# Patient Record
Sex: Female | Born: 1955 | Race: Black or African American | Hispanic: No | Marital: Single | Smoking: Former smoker
Health system: Southern US, Community
[De-identification: ages and names within clinical notes are randomized; demographics above are authoritative.]

## PROBLEM LIST (undated history)

## (undated) DIAGNOSIS — E785 Hyperlipidemia, unspecified: Secondary | ICD-10-CM

## (undated) DIAGNOSIS — T7840XA Allergy, unspecified, initial encounter: Secondary | ICD-10-CM

## (undated) HISTORY — DX: Hyperlipidemia, unspecified: E78.5

## (undated) HISTORY — PX: BREAST BIOPSY: SHX20

## (undated) HISTORY — PX: WISDOM TOOTH EXTRACTION: SHX21

## (undated) HISTORY — DX: Allergy, unspecified, initial encounter: T78.40XA

## (undated) HISTORY — PX: TUBAL LIGATION: SHX77

---

## 1999-12-27 ENCOUNTER — Other Ambulatory Visit: Admission: RE | Admit: 1999-12-27 | Discharge: 1999-12-27 | Payer: Self-pay | Admitting: *Deleted

## 2000-01-31 ENCOUNTER — Encounter: Admission: RE | Admit: 2000-01-31 | Discharge: 2000-01-31 | Payer: Self-pay | Admitting: *Deleted

## 2000-01-31 ENCOUNTER — Encounter: Payer: Self-pay | Admitting: *Deleted

## 2000-03-18 ENCOUNTER — Encounter: Payer: Self-pay | Admitting: Family Medicine

## 2000-03-18 ENCOUNTER — Ambulatory Visit (HOSPITAL_COMMUNITY): Admission: RE | Admit: 2000-03-18 | Discharge: 2000-03-18 | Payer: Self-pay | Admitting: Family Medicine

## 2000-04-07 ENCOUNTER — Encounter: Admission: RE | Admit: 2000-04-07 | Discharge: 2000-05-08 | Payer: Self-pay | Admitting: Family Medicine

## 2003-05-23 ENCOUNTER — Encounter: Admission: RE | Admit: 2003-05-23 | Discharge: 2003-05-23 | Payer: Self-pay | Admitting: Obstetrics and Gynecology

## 2003-05-23 ENCOUNTER — Encounter: Payer: Self-pay | Admitting: Obstetrics and Gynecology

## 2004-07-30 ENCOUNTER — Encounter: Admission: RE | Admit: 2004-07-30 | Discharge: 2004-07-30 | Payer: Self-pay | Admitting: Obstetrics and Gynecology

## 2007-10-13 ENCOUNTER — Encounter: Admission: RE | Admit: 2007-10-13 | Discharge: 2007-10-13 | Payer: Self-pay | Admitting: Family Medicine

## 2008-10-23 ENCOUNTER — Encounter: Admission: RE | Admit: 2008-10-23 | Discharge: 2008-10-23 | Payer: Self-pay | Admitting: Family Medicine

## 2009-10-29 ENCOUNTER — Encounter: Admission: RE | Admit: 2009-10-29 | Discharge: 2009-10-29 | Payer: Self-pay | Admitting: Cardiology

## 2009-10-29 ENCOUNTER — Encounter: Admission: RE | Admit: 2009-10-29 | Discharge: 2009-10-29 | Payer: Self-pay | Admitting: Family Medicine

## 2010-12-02 ENCOUNTER — Encounter
Admission: RE | Admit: 2010-12-02 | Discharge: 2010-12-02 | Payer: Self-pay | Source: Home / Self Care | Attending: Family Medicine | Admitting: Family Medicine

## 2010-12-15 ENCOUNTER — Encounter: Payer: Self-pay | Admitting: Family Medicine

## 2011-11-03 ENCOUNTER — Other Ambulatory Visit: Payer: Self-pay | Admitting: Family Medicine

## 2011-11-03 DIAGNOSIS — Z1231 Encounter for screening mammogram for malignant neoplasm of breast: Secondary | ICD-10-CM

## 2011-12-08 ENCOUNTER — Ambulatory Visit: Payer: Self-pay

## 2012-09-30 ENCOUNTER — Other Ambulatory Visit: Payer: Self-pay | Admitting: Family Medicine

## 2012-09-30 DIAGNOSIS — Z1231 Encounter for screening mammogram for malignant neoplasm of breast: Secondary | ICD-10-CM

## 2012-11-10 ENCOUNTER — Ambulatory Visit
Admission: RE | Admit: 2012-11-10 | Discharge: 2012-11-10 | Disposition: A | Discharge status: Home / Self Care | Payer: Federal, State, Local not specified - PPO | Source: Ambulatory Visit | Attending: Family Medicine | Admitting: Family Medicine

## 2012-11-10 DIAGNOSIS — Z1231 Encounter for screening mammogram for malignant neoplasm of breast: Secondary | ICD-10-CM

## 2012-11-12 ENCOUNTER — Other Ambulatory Visit: Payer: Self-pay | Admitting: Family Medicine

## 2012-11-23 ENCOUNTER — Other Ambulatory Visit: Payer: Self-pay | Admitting: Family Medicine

## 2012-11-23 DIAGNOSIS — R928 Other abnormal and inconclusive findings on diagnostic imaging of breast: Secondary | ICD-10-CM

## 2012-12-01 ENCOUNTER — Ambulatory Visit
Admission: RE | Admit: 2012-12-01 | Discharge: 2012-12-01 | Disposition: A | Discharge status: Home / Self Care | Payer: Federal, State, Local not specified - PPO | Source: Ambulatory Visit | Attending: Family Medicine | Admitting: Family Medicine

## 2012-12-01 ENCOUNTER — Other Ambulatory Visit: Payer: Self-pay | Admitting: Family Medicine

## 2012-12-01 DIAGNOSIS — R928 Other abnormal and inconclusive findings on diagnostic imaging of breast: Secondary | ICD-10-CM

## 2012-12-14 ENCOUNTER — Ambulatory Visit
Admission: RE | Admit: 2012-12-14 | Discharge: 2012-12-14 | Disposition: A | Discharge status: Home / Self Care | Payer: Federal, State, Local not specified - PPO | Source: Ambulatory Visit | Attending: Family Medicine | Admitting: Family Medicine

## 2012-12-14 DIAGNOSIS — R928 Other abnormal and inconclusive findings on diagnostic imaging of breast: Secondary | ICD-10-CM

## 2013-11-04 ENCOUNTER — Ambulatory Visit (INDEPENDENT_AMBULATORY_CARE_PROVIDER_SITE_OTHER): Payer: Federal, State, Local not specified - PPO | Admitting: Podiatrist

## 2013-11-04 ENCOUNTER — Encounter: Payer: Self-pay | Admitting: Podiatrist

## 2013-11-04 ENCOUNTER — Ambulatory Visit (INDEPENDENT_AMBULATORY_CARE_PROVIDER_SITE_OTHER): Payer: Federal, State, Local not specified - PPO

## 2013-11-04 ENCOUNTER — Ambulatory Visit: Payer: Self-pay | Admitting: Podiatrist

## 2013-11-04 VITALS — BP 135/80 | HR 64 | Resp 16 | Ht 62.5 in | Wt 170.0 lb

## 2013-11-04 DIAGNOSIS — R52 Pain, unspecified: Secondary | ICD-10-CM

## 2013-11-04 DIAGNOSIS — M6740 Ganglion, unspecified site: Secondary | ICD-10-CM

## 2013-11-04 NOTE — Patient Instructions (Signed)
Ganglion Cyst A ganglion cyst is a noncancerous, fluid-filled lump that occurs near joints or tendons. The ganglion cyst grows out of a joint or the lining of a tendon.  The round or oval ganglion can be pea sized or larger than a grape. Increased activity may enlarge the size of the cyst because more fluid starts to build up.  CAUSES  It is not completely known what causes a ganglion cyst to grow. However, it may be related to:  Inflammation or irritation around the joint.  An injury.  Repetitive movements or overuse.  Arthritis. SYMPTOMS   Generally, the lump is painless without other symptoms. However, sometimes pain can be felt during activity or when pressure is applied to the lump. The lump may even be tender to the touch. Tingling, pain, numbness, or muscle weakness can occur if the ganglion cyst presses on a nerve.  DIAGNOSIS  Ganglion cysts are most often diagnosed based on a physical exam, noting where the cyst is and how it looks. Your caregiver will feel the lump and may shine a light alongside it. If it is a ganglion, a light often shines through it. Your caregiver may order an X-ray, ultrasound, or MRI to rule out other conditions. TREATMENT   Draining fluid from the lump with a needle (aspiration).  Injecting a steroid into the joint.  Surgery to remove the ganglion cyst and its stalk that is attached to the joint or tendon. However, ganglion cysts can grow back. HOME CARE INSTRUCTIONS   Do not press on the ganglion, poke it with a needle, or hit it with a heavy object. You may rub the lump gently and often. Sometimes fluid moves out of the cyst.  Only take medicines as directed by your caregiver.  Wear your brace or splint as directed by your caregiver. SEEK MEDICAL CARE IF:   Your ganglion becomes larger or more painful.  You have increased redness, red streaks, or swelling.  You have pus coming from the lump.  You have weakness or numbness in the affected  area. MAKE SURE YOU:   Understand these instructions.  Will watch your condition.  Will get help right away if you are not doing well or get worse. Document Released: 11/07/2000 Document Revised: 08/04/2012 Document Reviewed: 01/04/2008 [facility] Patient Information 2014 [place], [place].

## 2013-11-04 NOTE — Progress Notes (Signed)
MRN: [id number] Name: Darlene Cole  Sex: female Age: 57 y.o. DOB: 06-01-1956  Provider: Marlowe Aschoff P  Allergies: Review of patient's allergies indicates no known allergies.   Chief Complaint  Patient presents with  . Foot Pain    NP Painful Arch B/L;  "I have a Bunion from these shoes, that seam irritates me.  My arches have fallen."     HPI: Patient is 57 y.o. female who presents today for him bilateral feet. Patient states that her new balance shoes which she obtained at the advice of her primary care physician from the shoe market have rubbed on the top of her foot in the seen irritates the top of her foot. She now has a cyst on the top of the right foot over the first metatarsal head and causing pain. She also relates her feet have fallen arches which are also uncomfortable. 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     Past Medical History  Diagnosis Date  . Allergy   . Hyperlipidemia        Medication List       This list is accurate as of: 11/04/13  5:03 PM.  Always use your most recent med list.               BYSTOLIC 5 MG tablet  Generic drug:  nebivolol  Take 5 mg by mouth daily.     EPIPEN 2-PAK 0.3 mg/0.3 mL Soaj injection  Generic drug:  EPINEPHrine  0.3 mg once.     ibuprofen 400 MG tablet  Commonly known as:  ADVIL,MOTRIN  Take 800 mg by mouth as needed.     levocetirizine 5 MG tablet  Commonly known as:  XYZAL  Take 5 mg by mouth every evening.     lovastatin 20 MG tablet  Commonly known as:  MEVACOR  Take 20 mg by mouth.     NASONEX 50 MCG/ACT nasal spray  Generic drug:  mometasone  Place 2 sprays into the nose daily.         Past Surgical History  Procedure Laterality Date  . Tubal ligation    . Wisdom tooth extraction         Review of Systems  DATA OBTAINED: from patient intake form GENERAL: Feels well no fevers, no fatigue, no changes in appetite SKIN: No itching, no  rashes, no open lesions, no wounds EYES: No eye pain,no redness, no discharge EARS: No earache,no ringing of ears, no recent change in hearing NOSE: No congestion, no drainage, no bleeding  MOUTH/THROAT: No mouth pain, No sore throat, No difficulty chewing or swallowing  RESPIRATORY: No cough, no wheezing, no SOB CARDIAC: No chest pain,no heart palpitations,no new onset lower extremity edema  GI: No abdominal pain, No Nausea, no vomiting, no diarrhea, no heartburn or no reflux  GU: No dysuria, no increased frequency or urgency MUSCULOSKELETAL: No unrelieved bone/joint pain,  NEUROLOGIC: Awake, alert, appropriate to situation, No change in mental status. PSYCHIATRIC: No overt anxiety or sadness.No behavior issue.  AMBULATION:  Ambulates unassisted  Filed Vitals:   11/04/13 1325  BP: 135/80  Pulse: 64  Resp: 16    Physical Exam  GENERAL APPEARANCE: Alert, conversant. Appropriately groomed. No acute distress.  VASCULAR: Pedal pulses palpable and strong bilateral.  Capillary refill time is immediate to all digits,  Proximal to distal cooling it warm to warm.  Digital hair growth is present bilateral  NEUROLOGIC: sensation is intact epicritically and protectively to 5.07 monofilament at 5/5 sites bilateral.  Light touch is intact bilateral, vibratory sensation intact bilateral, achilles tendon reflex is intact bilateral.  MUSCULOSKELETAL: acceptable muscle strength, tone and stability bilateral. Minimal bunion deformity seen bilateral. DERMATOLOGIC: skin color, texture, and turger are within normal limits.  A cyst on the dorsal aspect of the right first metatarsal head is noted. Very small and measures about 6 mm in diameter and is freely movable.  Assessment  Ganglionic cyst dorsal aspect right first metatarsal head   Plan  Recommended aspiration of the cyst and the patient agreed a sterile prep was performed and the area was anesthetized with lidocaine Marcaine mixture. Next a 10 cc  syringe with a 20-gauge needle was utilized to aspirate some clear fluid from the cyst. Once aspirated dexamethasone phosphate was infiltrated into the cyst. She tolerated this well and addressed a compressive dressing was applied. Patient was instructed on aftercare. I recommended getting new shoes if she thinks that the shoes are reason she developed a cyst. I also discussed with her the possibility of getting inserts. Her inserts however are not covered by her insurance and therefore she'll continue wearing her when she obtained from the shoe market. If the cyst does not resolve I discussed with her that I would have to remove it in the office where the surgery center. She'll call of the cyst does not resolve after the aspiration.  Marlowe Aschoff DPM    EGERTON, KATHRYN P, DPM

## 2013-11-04 NOTE — Progress Notes (Deleted)
   Subjective:    Patient ID: Darlene Cole, female    DOB: Aug 13, 1956, 57 y.o.   MRN: [id number] "I have a Bunion from these shoes, that seam irritates me.  My arches have fallen."  Foot Pain This is a new (Arch Pain B/L) problem. Episode onset: 09/29/13. The problem occurs daily. The problem has been gradually worsening. Exacerbated by: sitting for long periods of time and get active again. Treatments tried: epsom salt soaks, alcohol rubs, Diabetic socks, New Balance sneakers, OTC inserts from [employer], saw a doctor at [employer]. The treatment provided no relief.      Review of Systems     Objective:   Physical Exam  Cyst dorsal top of right foot.  drainiage      Assessment & Plan:

## 2013-12-07 ENCOUNTER — Other Ambulatory Visit: Payer: Self-pay

## 2013-12-07 DIAGNOSIS — Z1231 Encounter for screening mammogram for malignant neoplasm of breast: Secondary | ICD-10-CM

## 2013-12-28 ENCOUNTER — Ambulatory Visit
Admission: RE | Admit: 2013-12-28 | Discharge: 2013-12-28 | Disposition: A | Discharge status: Home / Self Care | Payer: Federal, State, Local not specified - PPO | Source: Ambulatory Visit

## 2013-12-28 DIAGNOSIS — Z1231 Encounter for screening mammogram for malignant neoplasm of breast: Secondary | ICD-10-CM

## 2014-02-28 ENCOUNTER — Ambulatory Visit
Admission: RE | Admit: 2014-02-28 | Discharge: 2014-02-28 | Disposition: A | Discharge status: Home / Self Care | Payer: Federal, State, Local not specified - PPO | Source: Ambulatory Visit | Attending: Allergy and Immunology | Admitting: Allergy and Immunology

## 2014-02-28 ENCOUNTER — Other Ambulatory Visit: Payer: Self-pay | Admitting: Allergy and Immunology

## 2014-02-28 DIAGNOSIS — R05 Cough: Secondary | ICD-10-CM

## 2014-02-28 DIAGNOSIS — R059 Cough, unspecified: Secondary | ICD-10-CM

## 2015-07-03 ENCOUNTER — Ambulatory Visit
Admission: RE | Admit: 2015-07-03 | Discharge: 2015-07-03 | Disposition: A | Discharge status: Home / Self Care | Payer: Federal, State, Local not specified - PPO | Source: Ambulatory Visit | Attending: Family Medicine | Admitting: Family Medicine

## 2015-07-03 ENCOUNTER — Other Ambulatory Visit: Payer: Self-pay | Admitting: Family Medicine

## 2015-07-03 DIAGNOSIS — I159 Secondary hypertension, unspecified: Secondary | ICD-10-CM

## 2015-08-02 ENCOUNTER — Other Ambulatory Visit: Payer: Self-pay

## 2015-08-02 DIAGNOSIS — Z1231 Encounter for screening mammogram for malignant neoplasm of breast: Secondary | ICD-10-CM

## 2015-08-20 ENCOUNTER — Ambulatory Visit
Admission: RE | Admit: 2015-08-20 | Discharge: 2015-08-20 | Disposition: A | Discharge status: Home / Self Care | Payer: Federal, State, Local not specified - PPO | Source: Ambulatory Visit

## 2015-08-20 DIAGNOSIS — Z1231 Encounter for screening mammogram for malignant neoplasm of breast: Secondary | ICD-10-CM

## 2016-02-28 DIAGNOSIS — K080 Exfoliation of teeth due to systemic causes: Secondary | ICD-10-CM | POA: Diagnosis not present

## 2016-03-05 DIAGNOSIS — J301 Allergic rhinitis due to pollen: Secondary | ICD-10-CM | POA: Diagnosis not present

## 2016-03-05 DIAGNOSIS — J3089 Other allergic rhinitis: Secondary | ICD-10-CM | POA: Diagnosis not present

## 2016-03-19 DIAGNOSIS — I10 Essential (primary) hypertension: Secondary | ICD-10-CM | POA: Diagnosis not present

## 2016-03-19 DIAGNOSIS — E785 Hyperlipidemia, unspecified: Secondary | ICD-10-CM | POA: Diagnosis not present

## 2016-04-08 DIAGNOSIS — J3089 Other allergic rhinitis: Secondary | ICD-10-CM | POA: Diagnosis not present

## 2016-04-08 DIAGNOSIS — J3081 Allergic rhinitis due to animal (cat) (dog) hair and dander: Secondary | ICD-10-CM | POA: Diagnosis not present

## 2016-04-08 DIAGNOSIS — J301 Allergic rhinitis due to pollen: Secondary | ICD-10-CM | POA: Diagnosis not present

## 2016-04-08 DIAGNOSIS — H1045 Other chronic allergic conjunctivitis: Secondary | ICD-10-CM | POA: Diagnosis not present

## 2016-04-11 DIAGNOSIS — J301 Allergic rhinitis due to pollen: Secondary | ICD-10-CM | POA: Diagnosis not present

## 2016-04-11 DIAGNOSIS — J3089 Other allergic rhinitis: Secondary | ICD-10-CM | POA: Diagnosis not present

## 2016-04-24 DIAGNOSIS — J3089 Other allergic rhinitis: Secondary | ICD-10-CM | POA: Diagnosis not present

## 2016-04-24 DIAGNOSIS — J301 Allergic rhinitis due to pollen: Secondary | ICD-10-CM | POA: Diagnosis not present

## 2016-05-02 DIAGNOSIS — J301 Allergic rhinitis due to pollen: Secondary | ICD-10-CM | POA: Diagnosis not present

## 2016-05-02 DIAGNOSIS — J3089 Other allergic rhinitis: Secondary | ICD-10-CM | POA: Diagnosis not present

## 2016-05-07 DIAGNOSIS — J3089 Other allergic rhinitis: Secondary | ICD-10-CM | POA: Diagnosis not present

## 2016-05-07 DIAGNOSIS — J301 Allergic rhinitis due to pollen: Secondary | ICD-10-CM | POA: Diagnosis not present

## 2016-05-14 DIAGNOSIS — J301 Allergic rhinitis due to pollen: Secondary | ICD-10-CM | POA: Diagnosis not present

## 2016-05-14 DIAGNOSIS — J3089 Other allergic rhinitis: Secondary | ICD-10-CM | POA: Diagnosis not present

## 2016-05-16 DIAGNOSIS — J301 Allergic rhinitis due to pollen: Secondary | ICD-10-CM | POA: Diagnosis not present

## 2016-05-16 DIAGNOSIS — J3089 Other allergic rhinitis: Secondary | ICD-10-CM | POA: Diagnosis not present

## 2016-05-23 DIAGNOSIS — J3089 Other allergic rhinitis: Secondary | ICD-10-CM | POA: Diagnosis not present

## 2016-05-23 DIAGNOSIS — J301 Allergic rhinitis due to pollen: Secondary | ICD-10-CM | POA: Diagnosis not present

## 2016-06-18 DIAGNOSIS — K080 Exfoliation of teeth due to systemic causes: Secondary | ICD-10-CM | POA: Diagnosis not present

## 2016-09-05 ENCOUNTER — Other Ambulatory Visit: Payer: Self-pay | Admitting: Family Medicine

## 2016-09-05 DIAGNOSIS — Z1231 Encounter for screening mammogram for malignant neoplasm of breast: Secondary | ICD-10-CM

## 2016-09-09 DIAGNOSIS — I10 Essential (primary) hypertension: Secondary | ICD-10-CM | POA: Diagnosis not present

## 2016-09-09 DIAGNOSIS — Z23 Encounter for immunization: Secondary | ICD-10-CM | POA: Diagnosis not present

## 2016-09-09 DIAGNOSIS — E7800 Pure hypercholesterolemia, unspecified: Secondary | ICD-10-CM | POA: Diagnosis not present

## 2016-09-09 DIAGNOSIS — R9431 Abnormal electrocardiogram [ECG] [EKG]: Secondary | ICD-10-CM | POA: Diagnosis not present

## 2016-09-10 ENCOUNTER — Ambulatory Visit
Admission: RE | Admit: 2016-09-10 | Discharge: 2016-09-10 | Disposition: A | Discharge status: Home / Self Care | Payer: Federal, State, Local not specified - PPO | Source: Ambulatory Visit | Attending: Family Medicine | Admitting: Family Medicine

## 2016-09-10 DIAGNOSIS — Z1231 Encounter for screening mammogram for malignant neoplasm of breast: Secondary | ICD-10-CM | POA: Diagnosis not present

## 2016-09-17 DIAGNOSIS — K080 Exfoliation of teeth due to systemic causes: Secondary | ICD-10-CM | POA: Diagnosis not present

## 2016-09-19 DIAGNOSIS — E785 Hyperlipidemia, unspecified: Secondary | ICD-10-CM | POA: Diagnosis not present

## 2016-09-19 DIAGNOSIS — I10 Essential (primary) hypertension: Secondary | ICD-10-CM | POA: Diagnosis not present

## 2016-09-19 DIAGNOSIS — Z6835 Body mass index (BMI) 35.0-35.9, adult: Secondary | ICD-10-CM | POA: Diagnosis not present

## 2016-10-03 DIAGNOSIS — F5102 Adjustment insomnia: Secondary | ICD-10-CM | POA: Diagnosis not present

## 2016-10-03 DIAGNOSIS — E785 Hyperlipidemia, unspecified: Secondary | ICD-10-CM | POA: Diagnosis not present

## 2016-10-03 DIAGNOSIS — I10 Essential (primary) hypertension: Secondary | ICD-10-CM | POA: Diagnosis not present

## 2016-12-31 DIAGNOSIS — K080 Exfoliation of teeth due to systemic causes: Secondary | ICD-10-CM | POA: Diagnosis not present

## 2017-02-13 DIAGNOSIS — E785 Hyperlipidemia, unspecified: Secondary | ICD-10-CM | POA: Diagnosis not present

## 2017-02-13 DIAGNOSIS — I10 Essential (primary) hypertension: Secondary | ICD-10-CM | POA: Diagnosis not present

## 2017-02-16 DIAGNOSIS — J399 Disease of upper respiratory tract, unspecified: Secondary | ICD-10-CM | POA: Diagnosis not present

## 2017-02-16 DIAGNOSIS — Z6834 Body mass index (BMI) 34.0-34.9, adult: Secondary | ICD-10-CM | POA: Diagnosis not present

## 2017-02-16 DIAGNOSIS — E785 Hyperlipidemia, unspecified: Secondary | ICD-10-CM | POA: Diagnosis not present

## 2017-02-16 DIAGNOSIS — I10 Essential (primary) hypertension: Secondary | ICD-10-CM | POA: Diagnosis not present

## 2017-04-08 DIAGNOSIS — H1045 Other chronic allergic conjunctivitis: Secondary | ICD-10-CM | POA: Diagnosis not present

## 2017-04-08 DIAGNOSIS — J3089 Other allergic rhinitis: Secondary | ICD-10-CM | POA: Diagnosis not present

## 2017-04-08 DIAGNOSIS — J3081 Allergic rhinitis due to animal (cat) (dog) hair and dander: Secondary | ICD-10-CM | POA: Diagnosis not present

## 2017-04-08 DIAGNOSIS — J301 Allergic rhinitis due to pollen: Secondary | ICD-10-CM | POA: Diagnosis not present

## 2017-04-21 DIAGNOSIS — K080 Exfoliation of teeth due to systemic causes: Secondary | ICD-10-CM | POA: Diagnosis not present

## 2017-07-02 DIAGNOSIS — I10 Essential (primary) hypertension: Secondary | ICD-10-CM | POA: Diagnosis not present

## 2017-07-02 DIAGNOSIS — E785 Hyperlipidemia, unspecified: Secondary | ICD-10-CM | POA: Diagnosis not present

## 2017-07-07 DIAGNOSIS — I10 Essential (primary) hypertension: Secondary | ICD-10-CM | POA: Diagnosis not present

## 2017-07-07 DIAGNOSIS — E785 Hyperlipidemia, unspecified: Secondary | ICD-10-CM | POA: Diagnosis not present

## 2017-08-03 DIAGNOSIS — K080 Exfoliation of teeth due to systemic causes: Secondary | ICD-10-CM | POA: Diagnosis not present

## 2017-12-04 DIAGNOSIS — J301 Allergic rhinitis due to pollen: Secondary | ICD-10-CM | POA: Diagnosis not present

## 2017-12-04 DIAGNOSIS — J3089 Other allergic rhinitis: Secondary | ICD-10-CM | POA: Diagnosis not present

## 2017-12-04 DIAGNOSIS — J3081 Allergic rhinitis due to animal (cat) (dog) hair and dander: Secondary | ICD-10-CM | POA: Diagnosis not present

## 2017-12-04 DIAGNOSIS — Z91018 Allergy to other foods: Secondary | ICD-10-CM | POA: Diagnosis not present

## 2017-12-08 DIAGNOSIS — K080 Exfoliation of teeth due to systemic causes: Secondary | ICD-10-CM | POA: Diagnosis not present

## 2017-12-10 DIAGNOSIS — E785 Hyperlipidemia, unspecified: Secondary | ICD-10-CM | POA: Diagnosis not present

## 2017-12-10 DIAGNOSIS — I10 Essential (primary) hypertension: Secondary | ICD-10-CM | POA: Diagnosis not present

## 2017-12-14 DIAGNOSIS — E785 Hyperlipidemia, unspecified: Secondary | ICD-10-CM | POA: Diagnosis not present

## 2017-12-14 DIAGNOSIS — R635 Abnormal weight gain: Secondary | ICD-10-CM | POA: Diagnosis not present

## 2017-12-14 DIAGNOSIS — F1010 Alcohol abuse, uncomplicated: Secondary | ICD-10-CM | POA: Diagnosis not present

## 2017-12-14 DIAGNOSIS — I10 Essential (primary) hypertension: Secondary | ICD-10-CM | POA: Diagnosis not present

## 2017-12-28 DIAGNOSIS — I10 Essential (primary) hypertension: Secondary | ICD-10-CM | POA: Diagnosis not present

## 2018-01-28 DIAGNOSIS — R635 Abnormal weight gain: Secondary | ICD-10-CM | POA: Diagnosis not present

## 2018-01-28 DIAGNOSIS — F1010 Alcohol abuse, uncomplicated: Secondary | ICD-10-CM | POA: Diagnosis not present

## 2018-01-28 DIAGNOSIS — I10 Essential (primary) hypertension: Secondary | ICD-10-CM | POA: Diagnosis not present

## 2018-01-28 DIAGNOSIS — E785 Hyperlipidemia, unspecified: Secondary | ICD-10-CM | POA: Diagnosis not present

## 2018-02-01 DIAGNOSIS — E785 Hyperlipidemia, unspecified: Secondary | ICD-10-CM | POA: Diagnosis not present

## 2018-02-01 DIAGNOSIS — F1010 Alcohol abuse, uncomplicated: Secondary | ICD-10-CM | POA: Diagnosis not present

## 2018-02-01 DIAGNOSIS — I10 Essential (primary) hypertension: Secondary | ICD-10-CM | POA: Diagnosis not present

## 2018-02-04 DIAGNOSIS — K080 Exfoliation of teeth due to systemic causes: Secondary | ICD-10-CM | POA: Diagnosis not present

## 2018-03-11 ENCOUNTER — Other Ambulatory Visit: Payer: Self-pay | Admitting: Family Medicine

## 2018-03-11 DIAGNOSIS — Z1231 Encounter for screening mammogram for malignant neoplasm of breast: Secondary | ICD-10-CM

## 2018-03-11 DIAGNOSIS — E785 Hyperlipidemia, unspecified: Secondary | ICD-10-CM | POA: Diagnosis not present

## 2018-03-11 DIAGNOSIS — F1010 Alcohol abuse, uncomplicated: Secondary | ICD-10-CM | POA: Diagnosis not present

## 2018-03-26 DIAGNOSIS — E785 Hyperlipidemia, unspecified: Secondary | ICD-10-CM | POA: Diagnosis not present

## 2018-04-01 ENCOUNTER — Ambulatory Visit
Admission: RE | Admit: 2018-04-01 | Discharge: 2018-04-01 | Disposition: A | Discharge status: Home / Self Care | Payer: Federal, State, Local not specified - PPO | Source: Ambulatory Visit | Attending: Family Medicine | Admitting: Family Medicine

## 2018-04-01 DIAGNOSIS — Z1231 Encounter for screening mammogram for malignant neoplasm of breast: Secondary | ICD-10-CM | POA: Diagnosis not present

## 2018-04-27 DIAGNOSIS — K080 Exfoliation of teeth due to systemic causes: Secondary | ICD-10-CM | POA: Diagnosis not present

## 2018-06-21 DIAGNOSIS — E785 Hyperlipidemia, unspecified: Secondary | ICD-10-CM | POA: Diagnosis not present

## 2018-06-21 DIAGNOSIS — I10 Essential (primary) hypertension: Secondary | ICD-10-CM | POA: Diagnosis not present

## 2018-06-21 DIAGNOSIS — F1010 Alcohol abuse, uncomplicated: Secondary | ICD-10-CM | POA: Diagnosis not present

## 2018-08-02 DIAGNOSIS — I10 Essential (primary) hypertension: Secondary | ICD-10-CM | POA: Diagnosis not present

## 2018-08-02 DIAGNOSIS — E785 Hyperlipidemia, unspecified: Secondary | ICD-10-CM | POA: Diagnosis not present

## 2018-08-02 DIAGNOSIS — Z6827 Body mass index (BMI) 27.0-27.9, adult: Secondary | ICD-10-CM | POA: Diagnosis not present

## 2018-09-01 DIAGNOSIS — K080 Exfoliation of teeth due to systemic causes: Secondary | ICD-10-CM | POA: Diagnosis not present

## 2018-11-01 DIAGNOSIS — I10 Essential (primary) hypertension: Secondary | ICD-10-CM | POA: Diagnosis not present

## 2018-11-01 DIAGNOSIS — Z6826 Body mass index (BMI) 26.0-26.9, adult: Secondary | ICD-10-CM | POA: Diagnosis not present

## 2018-12-06 DIAGNOSIS — H1045 Other chronic allergic conjunctivitis: Secondary | ICD-10-CM | POA: Diagnosis not present

## 2018-12-06 DIAGNOSIS — J3081 Allergic rhinitis due to animal (cat) (dog) hair and dander: Secondary | ICD-10-CM | POA: Diagnosis not present

## 2018-12-06 DIAGNOSIS — J301 Allergic rhinitis due to pollen: Secondary | ICD-10-CM | POA: Diagnosis not present

## 2018-12-06 DIAGNOSIS — J3089 Other allergic rhinitis: Secondary | ICD-10-CM | POA: Diagnosis not present

## 2019-03-28 DIAGNOSIS — I10 Essential (primary) hypertension: Secondary | ICD-10-CM | POA: Diagnosis not present

## 2019-03-28 DIAGNOSIS — F1010 Alcohol abuse, uncomplicated: Secondary | ICD-10-CM | POA: Diagnosis not present

## 2019-03-28 DIAGNOSIS — R635 Abnormal weight gain: Secondary | ICD-10-CM | POA: Diagnosis not present

## 2019-03-28 DIAGNOSIS — F5102 Adjustment insomnia: Secondary | ICD-10-CM | POA: Diagnosis not present

## 2019-03-28 DIAGNOSIS — E785 Hyperlipidemia, unspecified: Secondary | ICD-10-CM | POA: Diagnosis not present

## 2019-08-05 DIAGNOSIS — E7800 Pure hypercholesterolemia, unspecified: Secondary | ICD-10-CM | POA: Diagnosis not present

## 2019-08-05 DIAGNOSIS — E785 Hyperlipidemia, unspecified: Secondary | ICD-10-CM | POA: Diagnosis not present

## 2019-08-05 DIAGNOSIS — I10 Essential (primary) hypertension: Secondary | ICD-10-CM | POA: Diagnosis not present

## 2019-08-05 DIAGNOSIS — Z23 Encounter for immunization: Secondary | ICD-10-CM | POA: Diagnosis not present

## 2019-12-05 DIAGNOSIS — H1045 Other chronic allergic conjunctivitis: Secondary | ICD-10-CM | POA: Diagnosis not present

## 2019-12-05 DIAGNOSIS — J301 Allergic rhinitis due to pollen: Secondary | ICD-10-CM | POA: Diagnosis not present

## 2019-12-05 DIAGNOSIS — J3089 Other allergic rhinitis: Secondary | ICD-10-CM | POA: Diagnosis not present

## 2019-12-05 DIAGNOSIS — J3081 Allergic rhinitis due to animal (cat) (dog) hair and dander: Secondary | ICD-10-CM | POA: Diagnosis not present

## 2020-01-27 DIAGNOSIS — I10 Essential (primary) hypertension: Secondary | ICD-10-CM | POA: Diagnosis not present

## 2020-01-27 DIAGNOSIS — E785 Hyperlipidemia, unspecified: Secondary | ICD-10-CM | POA: Diagnosis not present

## 2020-01-27 DIAGNOSIS — F1010 Alcohol abuse, uncomplicated: Secondary | ICD-10-CM | POA: Diagnosis not present

## 2020-01-27 DIAGNOSIS — R635 Abnormal weight gain: Secondary | ICD-10-CM | POA: Diagnosis not present

## 2020-04-30 DIAGNOSIS — I10 Essential (primary) hypertension: Secondary | ICD-10-CM | POA: Diagnosis not present

## 2020-04-30 DIAGNOSIS — F1010 Alcohol abuse, uncomplicated: Secondary | ICD-10-CM | POA: Diagnosis not present

## 2020-05-04 IMAGING — MG DIGITAL SCREENING BILATERAL MAMMOGRAM WITH TOMO AND CAD
8 series · 8 of 24 positions shown · non-contrast
Comparison: Previous exam(s).

CLINICAL DATA: Screening.

EXAM:
DIGITAL SCREENING BILATERAL MAMMOGRAM WITH TOMO AND CAD

[L MLO synth-2D]
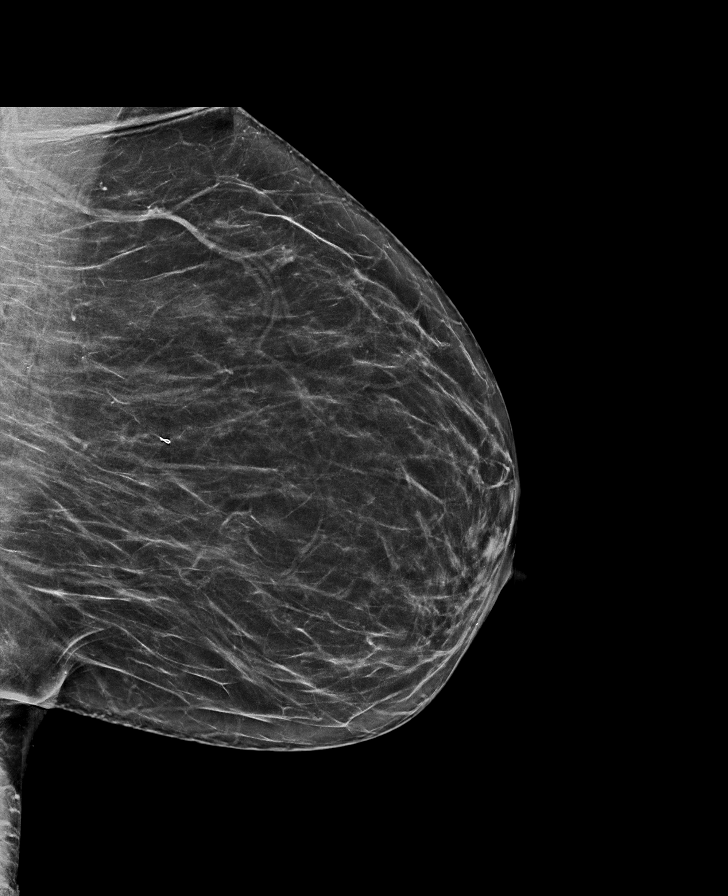

[L CC synth-2D]
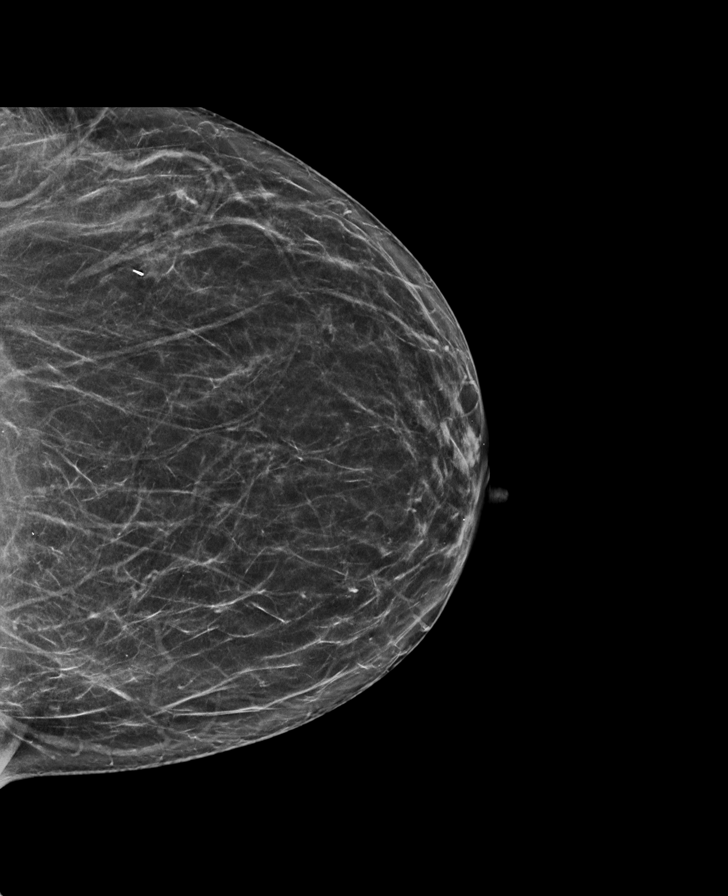

[R CC synth-2D]
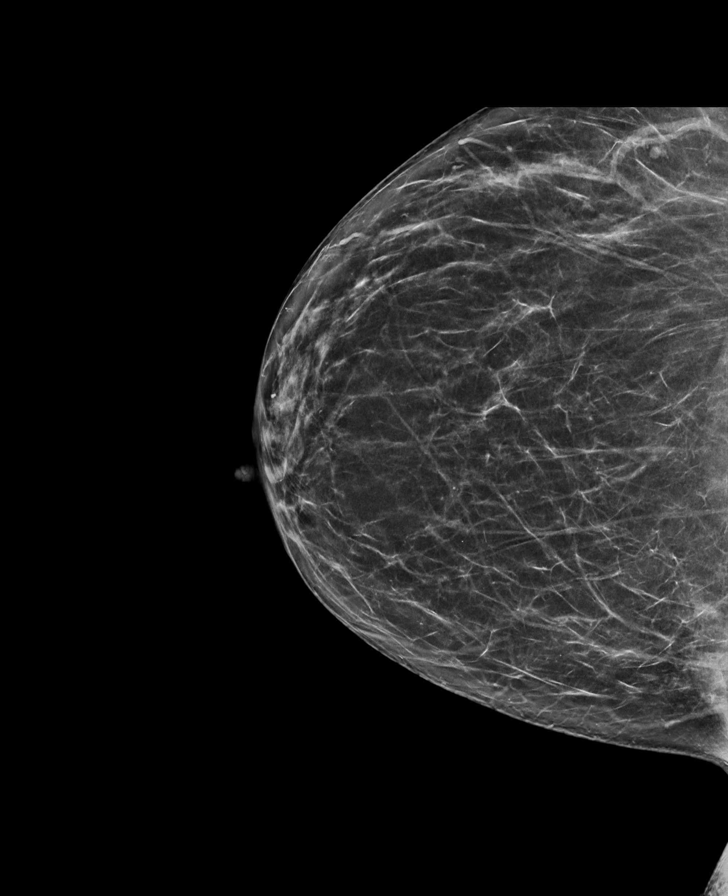

[R MLO synth-2D]
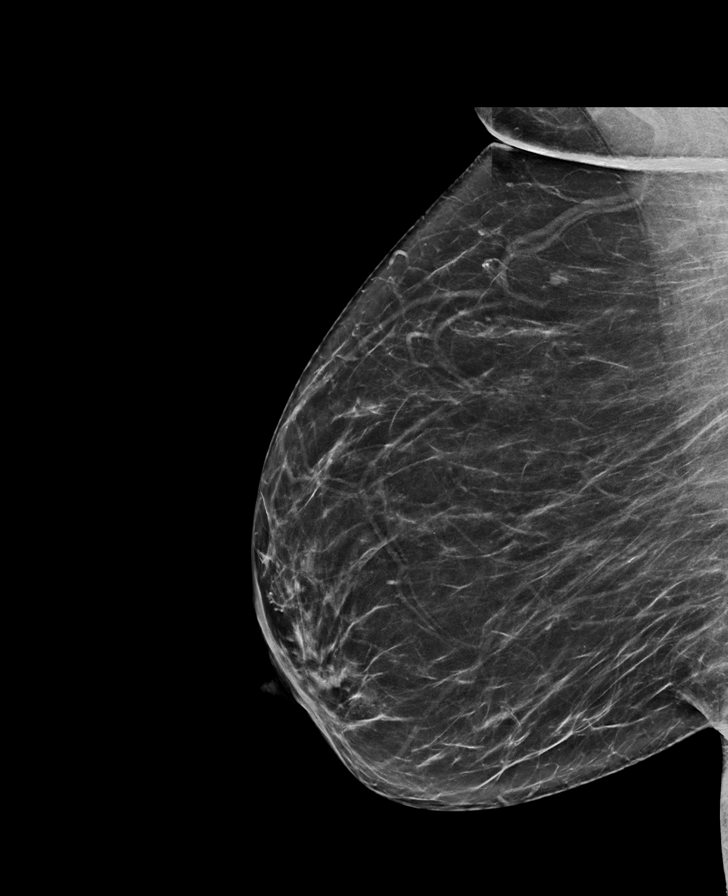

[L CC tomo · tomo slice 37/72.0]
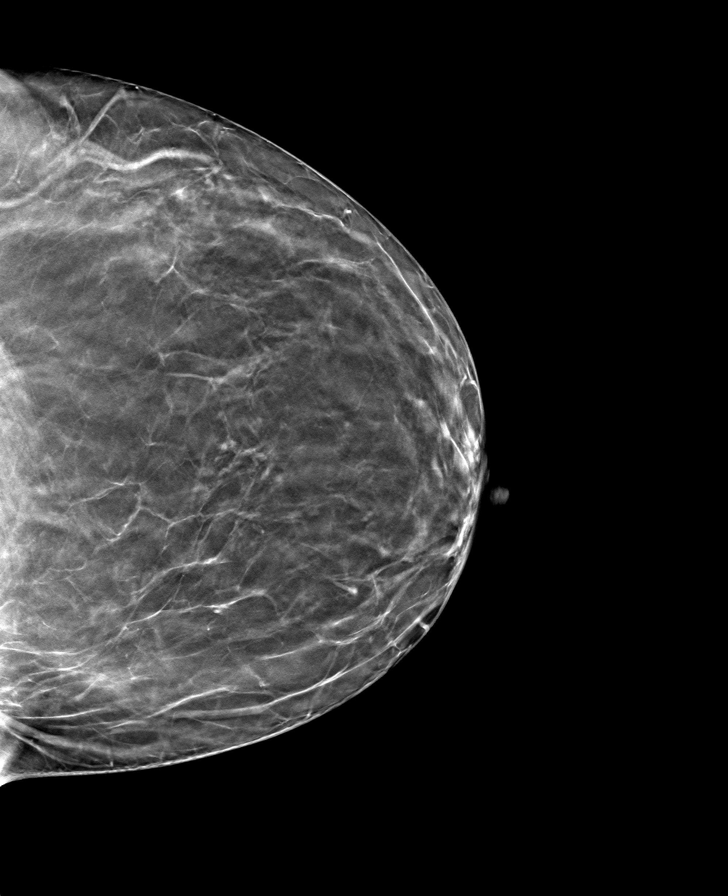

[R MLO tomo · tomo slice 39/77.0]
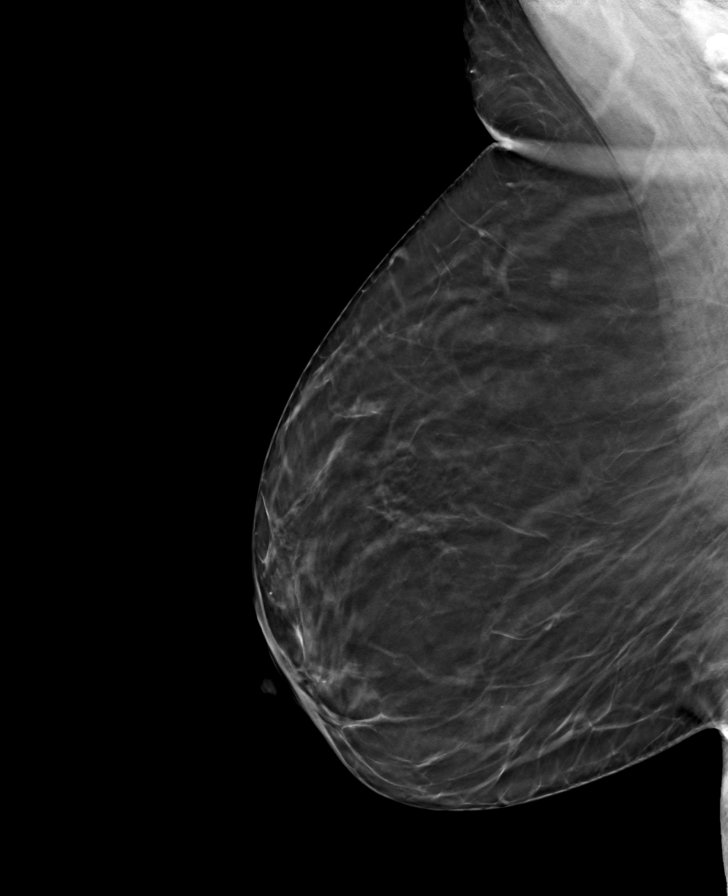

[R CC tomo · tomo slice 37/73.0]
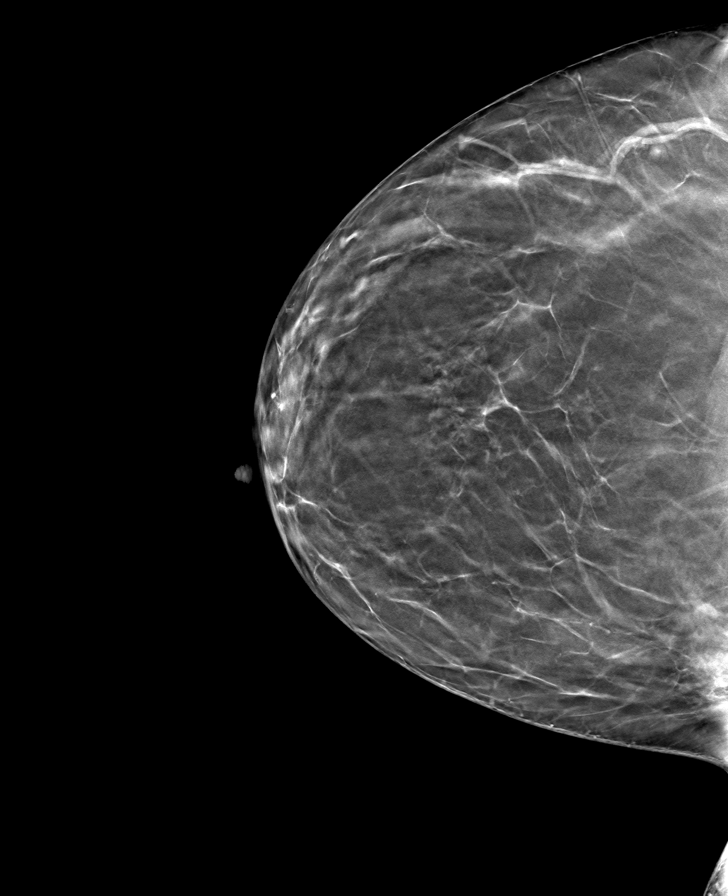

[L MLO tomo · tomo slice 38/75.0]
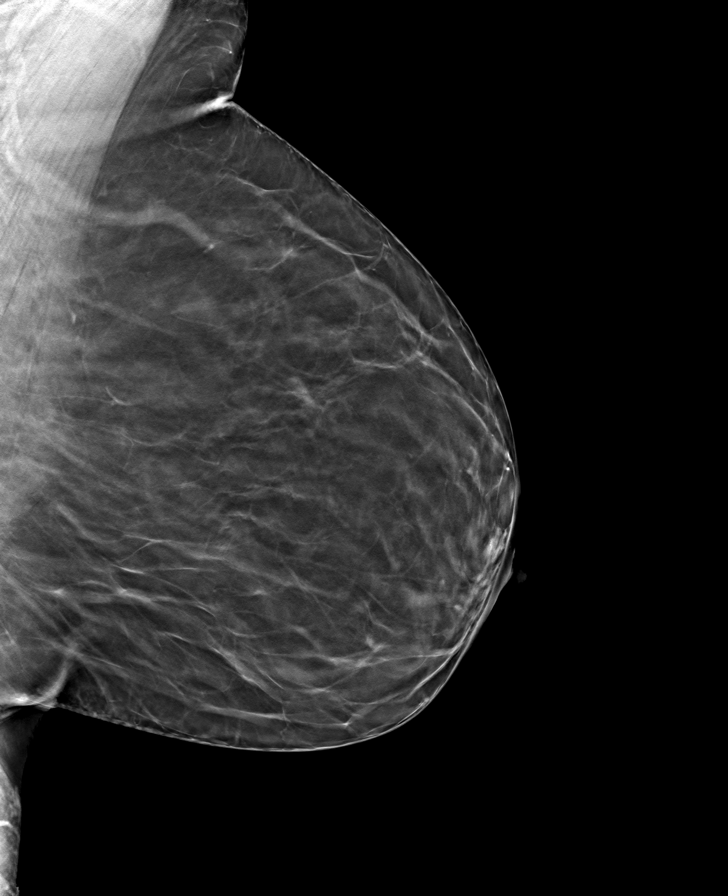

[8 of 24 positions shown; findings below may reference images not displayed]

ACR Breast Density Category b: There are scattered areas of
fibroglandular density.
FINDINGS: There are no findings suspicious for malignancy. Images were
processed with CAD.
IMPRESSION: No mammographic evidence of malignancy. A result letter of this
screening mammogram will be mailed directly to the patient.

RECOMMENDATION:
Screening mammogram in one year. (Code:[id number]-775)

BI-RADS CATEGORY  1: Negative.

## 2020-06-01 DIAGNOSIS — I10 Essential (primary) hypertension: Secondary | ICD-10-CM | POA: Diagnosis not present

## 2020-06-01 DIAGNOSIS — E785 Hyperlipidemia, unspecified: Secondary | ICD-10-CM | POA: Diagnosis not present

## 2020-06-01 DIAGNOSIS — F1010 Alcohol abuse, uncomplicated: Secondary | ICD-10-CM | POA: Diagnosis not present

## 2020-10-29 DIAGNOSIS — Z23 Encounter for immunization: Secondary | ICD-10-CM | POA: Diagnosis not present

## 2020-11-02 DIAGNOSIS — E785 Hyperlipidemia, unspecified: Secondary | ICD-10-CM | POA: Diagnosis not present

## 2020-11-02 DIAGNOSIS — I10 Essential (primary) hypertension: Secondary | ICD-10-CM | POA: Diagnosis not present

## 2020-11-05 DIAGNOSIS — F1010 Alcohol abuse, uncomplicated: Secondary | ICD-10-CM | POA: Diagnosis not present

## 2020-11-05 DIAGNOSIS — I10 Essential (primary) hypertension: Secondary | ICD-10-CM | POA: Diagnosis not present

## 2020-11-05 DIAGNOSIS — E785 Hyperlipidemia, unspecified: Secondary | ICD-10-CM | POA: Diagnosis not present

## 2020-12-03 DIAGNOSIS — H1045 Other chronic allergic conjunctivitis: Secondary | ICD-10-CM | POA: Diagnosis not present

## 2020-12-03 DIAGNOSIS — J3081 Allergic rhinitis due to animal (cat) (dog) hair and dander: Secondary | ICD-10-CM | POA: Diagnosis not present

## 2020-12-03 DIAGNOSIS — J301 Allergic rhinitis due to pollen: Secondary | ICD-10-CM | POA: Diagnosis not present

## 2020-12-03 DIAGNOSIS — J3089 Other allergic rhinitis: Secondary | ICD-10-CM | POA: Diagnosis not present

## 2020-12-06 DIAGNOSIS — I10 Essential (primary) hypertension: Secondary | ICD-10-CM | POA: Diagnosis not present

## 2020-12-06 DIAGNOSIS — E785 Hyperlipidemia, unspecified: Secondary | ICD-10-CM | POA: Diagnosis not present

## 2020-12-06 DIAGNOSIS — F1010 Alcohol abuse, uncomplicated: Secondary | ICD-10-CM | POA: Diagnosis not present

## 2021-02-28 DIAGNOSIS — Z23 Encounter for immunization: Secondary | ICD-10-CM | POA: Diagnosis not present

## 2021-03-01 DIAGNOSIS — F1010 Alcohol abuse, uncomplicated: Secondary | ICD-10-CM | POA: Diagnosis not present

## 2021-03-01 DIAGNOSIS — D649 Anemia, unspecified: Secondary | ICD-10-CM | POA: Diagnosis not present

## 2021-03-01 DIAGNOSIS — D644 Congenital dyserythropoietic anemia: Secondary | ICD-10-CM | POA: Diagnosis not present

## 2021-03-01 DIAGNOSIS — E785 Hyperlipidemia, unspecified: Secondary | ICD-10-CM | POA: Diagnosis not present

## 2021-03-01 DIAGNOSIS — I10 Essential (primary) hypertension: Secondary | ICD-10-CM | POA: Diagnosis not present

## 2021-07-05 DIAGNOSIS — E785 Hyperlipidemia, unspecified: Secondary | ICD-10-CM | POA: Diagnosis not present

## 2021-07-05 DIAGNOSIS — D649 Anemia, unspecified: Secondary | ICD-10-CM | POA: Diagnosis not present

## 2021-07-05 DIAGNOSIS — F1610 Hallucinogen abuse, uncomplicated: Secondary | ICD-10-CM | POA: Diagnosis not present

## 2021-07-05 DIAGNOSIS — I10 Essential (primary) hypertension: Secondary | ICD-10-CM | POA: Diagnosis not present

## 2021-07-05 DIAGNOSIS — Z6829 Body mass index (BMI) 29.0-29.9, adult: Secondary | ICD-10-CM | POA: Diagnosis not present

## 2021-08-07 DIAGNOSIS — H5203 Hypermetropia, bilateral: Secondary | ICD-10-CM | POA: Diagnosis not present

## 2021-08-29 ENCOUNTER — Other Ambulatory Visit: Payer: Self-pay | Admitting: Family Medicine

## 2021-08-29 DIAGNOSIS — Z1231 Encounter for screening mammogram for malignant neoplasm of breast: Secondary | ICD-10-CM

## 2021-08-30 ENCOUNTER — Ambulatory Visit
Admission: RE | Admit: 2021-08-30 | Discharge: 2021-08-30 | Disposition: A | Discharge status: Home / Self Care | Payer: Federal, State, Local not specified - PPO | Source: Ambulatory Visit | Attending: Family Medicine | Admitting: Family Medicine

## 2021-08-30 ENCOUNTER — Other Ambulatory Visit: Payer: Self-pay

## 2021-08-30 DIAGNOSIS — Z1231 Encounter for screening mammogram for malignant neoplasm of breast: Secondary | ICD-10-CM

## 2021-09-05 ENCOUNTER — Other Ambulatory Visit: Payer: Self-pay | Admitting: Family Medicine

## 2021-09-05 DIAGNOSIS — R928 Other abnormal and inconclusive findings on diagnostic imaging of breast: Secondary | ICD-10-CM

## 2021-09-21 ENCOUNTER — Ambulatory Visit
Admission: RE | Admit: 2021-09-21 | Discharge: 2021-09-21 | Disposition: A | Discharge status: Home / Self Care | Payer: Federal, State, Local not specified - PPO | Source: Ambulatory Visit | Attending: Family Medicine | Admitting: Family Medicine

## 2021-09-21 ENCOUNTER — Other Ambulatory Visit: Payer: Self-pay | Admitting: Family Medicine

## 2021-09-21 DIAGNOSIS — R928 Other abnormal and inconclusive findings on diagnostic imaging of breast: Secondary | ICD-10-CM

## 2021-09-21 DIAGNOSIS — N6489 Other specified disorders of breast: Secondary | ICD-10-CM

## 2021-09-21 DIAGNOSIS — R922 Inconclusive mammogram: Secondary | ICD-10-CM | POA: Diagnosis not present

## 2021-09-23 ENCOUNTER — Other Ambulatory Visit: Payer: Federal, State, Local not specified - PPO

## 2021-10-04 DIAGNOSIS — E785 Hyperlipidemia, unspecified: Secondary | ICD-10-CM | POA: Diagnosis not present

## 2021-10-04 DIAGNOSIS — I10 Essential (primary) hypertension: Secondary | ICD-10-CM | POA: Diagnosis not present

## 2021-10-04 DIAGNOSIS — F1010 Alcohol abuse, uncomplicated: Secondary | ICD-10-CM | POA: Diagnosis not present

## 2022-02-04 ENCOUNTER — Other Ambulatory Visit: Payer: Self-pay

## 2022-02-04 ENCOUNTER — Other Ambulatory Visit (HOSPITAL_COMMUNITY)
Admission: RE | Admit: 2022-02-04 | Discharge: 2022-02-04 | Disposition: A | Discharge status: Home / Self Care | Payer: Federal, State, Local not specified - PPO | Source: Ambulatory Visit | Attending: Medical | Admitting: Medical

## 2022-02-04 ENCOUNTER — Ambulatory Visit (INDEPENDENT_AMBULATORY_CARE_PROVIDER_SITE_OTHER): Payer: Federal, State, Local not specified - PPO | Admitting: Obstetrics and Gynecology

## 2022-02-04 ENCOUNTER — Encounter: Payer: Self-pay | Admitting: Obstetrics and Gynecology

## 2022-02-04 ENCOUNTER — Encounter: Payer: Federal, State, Local not specified - PPO | Admitting: Medical

## 2022-02-04 VITALS — BP 122/62 | HR 62 | Ht 63.0 in | Wt 157.5 lb

## 2022-02-04 DIAGNOSIS — Z124 Encounter for screening for malignant neoplasm of cervix: Secondary | ICD-10-CM

## 2022-02-04 DIAGNOSIS — Z01419 Encounter for gynecological examination (general) (routine) without abnormal findings: Secondary | ICD-10-CM

## 2022-02-04 DIAGNOSIS — Z113 Encounter for screening for infections with a predominantly sexual mode of transmission: Secondary | ICD-10-CM

## 2022-02-04 NOTE — Progress Notes (Signed)
Obstetrics and Gynecology ?Annual Patient Evaluation ? ?Appointment Date: 02/04/2022 ? ?OBGYN Clinic: [facility]  ? ?Primary Care Provider: Renaye Rakers ? ? ?Chief Complaint:  ?Chief Complaint  ?Patient presents with  ? Gynecologic Exam  ? ? ?History of Present Illness: Darlene Cole is a 66 y.o. African-American [phone] (LMP: age 55), seen for the above chief complaint.  ? ?She reports no issues today. She would like to have a Pap smear and also wants testing for STIs. She recalls her last Pap was in 2013.  ? ?She states she had a BTL in 1981 and her last period was approximately at the age of 66yo. She states has not had any bleeding since then. She states she never took medications for symptoms of menopause. Today, she denies VB, discharge, itching, pain.  ? ? ?Review of Systems: Pertinent items noted in HPI and remainder of comprehensive ROS otherwise negative.  ? ? ?Past Medical History:  ?Past Medical History:  ?Diagnosis Date  ? Allergy   ? Hyperlipidemia   ? ? ?Past Surgical History:  ?Past Surgical History:  ?Procedure Laterality Date  ? BREAST BIOPSY Left pt unsure  ? benign  ? TUBAL LIGATION    ? WISDOM TOOTH EXTRACTION    ? ? ?Past Obstetrical History:  ?OB History  ?Gravida Para Term Preterm AB Living  ?5       3 3   ?SAB IAB Ectopic Multiple Live Births  ?        3  ?  ?# Outcome Date GA Lbr Len/2nd Weight Sex Delivery Anes PTL Lv  ?5 Gravida           ?4 Gravida           ?3 AB           ?2 AB           ?1 AB           ?  ?Obstetric Comments  ?Svd x 3  ? ? ?Past Gynecological History: As per HPI. ?History of HRT use: No. ? ?Social History:  ?Social History  ? ?Socioeconomic History  ? Marital status: Single  ?  Spouse name: Not on file  ? Number of children: Not on file  ? Years of education: Not on file  ? Highest education level: Not on file  ?Occupational History  ? Not on file  ?Tobacco Use  ? Smoking status: Former  ? Smokeless tobacco: Never  ?Substance and  Sexual Activity  ? Alcohol use: Yes  ? Drug use: No  ? Sexual activity: Not on file  ?Other Topics Concern  ? Not on file  ?Social History Narrative  ? Not on file  ? ?Social Determinants of Health  ? ?Financial Resource Strain: Not on file  ?Food Insecurity: Not on file  ?Transportation Needs: Not on file  ?Physical Activity: Not on file  ?Stress: Not on file  ?Social Connections: Not on file  ?Intimate Partner Violence: Not on file  ? ? ?Family History:  ?Family History  ?Problem Relation Age of Onset  ? Cancer Mother   ? ? ?Medications ? . Pawelski had no medications administered during this visit. ?Current Outpatient Medications  ?Medication Sig Dispense Refill  ? levocetirizine (XYZAL) 5 MG tablet Take 5 mg by mouth every evening.     ? rosuvastatin (CRESTOR) 40 MG tablet Take 40 mg by mouth daily.    ? BYSTOLIC 5 MG tablet Take 5 mg  by mouth daily.  (Patient not taking: Reported on 02/04/2022)    ? EPIPEN 2-PAK 0.3 MG/0.3ML SOAJ injection 0.3 mg once.  (Patient not taking: Reported on 02/04/2022)    ? ibuprofen (ADVIL,MOTRIN) 400 MG tablet Take 800 mg by mouth as needed.  (Patient not taking: Reported on 02/04/2022)    ? lovastatin (MEVACOR) 20 MG tablet Take 20 mg by mouth.  (Patient not taking: Reported on 02/04/2022)    ? NASONEX 50 MCG/ACT nasal spray Place 2 sprays into the nose daily.  (Patient not taking: Reported on 02/04/2022)    ? ?No current facility-administered medications for this visit.  ? ? ?Allergies ?Patient has no known allergies. ? ? ?Physical Exam:  ?BP 122/62   Pulse 62   Ht 5\' 3"  (1.6 m)   Wt 157 lb 8 oz (71.4 kg)   BMI 27.90 kg/m?  Body mass index is 27.9 kg/m?. ? ?General appearance: Well nourished, well developed female in no acute distress.  ?Neck:  Supple, normal appearance, and no thyromegaly  ?Cardiovascular: normal s1 and s2.  No murmurs, rubs or gallops. ?Respiratory:  Clear to auscultation bilateral. Normal respiratory effort ?Abdomen: positive bowel sounds and no masses,  hernias; diffusely non tender to palpation, non distended ?Breasts: breasts appear normal, no suspicious masses, no skin or nipple changes or axillary nodes, and normal palpation. ?Neuro/Psych:  Normal mood and affect.  ?Skin:  Warm and dry.  ?Lymphatic:  No inguinal lymphadenopathy.  ? ?Pelvic exam: is not limited by body habitus ?EGBUS: within normal limits ?Vagina: within normal limits and with no blood or discharge in the vault ?Cervix: normal appearing cervix without tenderness, discharge or lesions. ?Uterus:  nonenlarged and non tender ?Adnexa:  normal adnexa and no mass, fullness, tenderness ?Rectovaginal: deferred ? ?Laboratory: none ? ?Radiology: none ? ?Assessment: pt doing well ? ?Plan:  ?1. Cervical cancer screening ?- Cytology - PAP( [place]) ? ?2. Screen for STD (sexually transmitted disease) ?- Cytology - PAP( [place]) ? ?3. Well woman exam ?No issues. Has mammogram repeat already scheduled for later this year.  ?- Cytology - PAP( [place]) ? ? ?RTC PRN ? ? MD ?Attending ?[facility]) ? ?

## 2022-02-07 LAB — CYTOLOGY - PAP
Chlamydia: NEGATIVE
Comment: NEGATIVE
Comment: NEGATIVE
Comment: NEGATIVE
Comment: NORMAL
Diagnosis: NEGATIVE
High risk HPV: NEGATIVE
Neisseria Gonorrhea: NEGATIVE
Trichomonas: NEGATIVE

## 2022-02-10 ENCOUNTER — Telehealth: Payer: Self-pay

## 2022-02-10 NOTE — Telephone Encounter (Addendum)
-----   Message from Aletha Halim, MD sent at 02/09/2022  9:23 PM EDT ----- ?Please let her know that her pap smear is normal and I recommend she repeat it in five years. Thanks ? ? ?Called pt; VM left stating I am calling with normal results. Will attempt to call patient a second time. ?

## 2022-02-14 NOTE — Telephone Encounter (Signed)
VM left stating all results normal. Should return for annual in 1 year and PAP in 5 years.  ?

## 2022-03-24 ENCOUNTER — Other Ambulatory Visit: Payer: Self-pay | Admitting: Family Medicine

## 2022-03-24 ENCOUNTER — Ambulatory Visit
Admission: RE | Admit: 2022-03-24 | Discharge: 2022-03-24 | Disposition: A | Discharge status: Home / Self Care | Payer: Federal, State, Local not specified - PPO | Source: Ambulatory Visit | Attending: Family Medicine | Admitting: Family Medicine

## 2022-03-24 DIAGNOSIS — N6489 Other specified disorders of breast: Secondary | ICD-10-CM

## 2022-09-25 ENCOUNTER — Ambulatory Visit
Admission: RE | Admit: 2022-09-25 | Discharge: 2022-09-25 | Disposition: A | Discharge status: Home / Self Care | Payer: Federal, State, Local not specified - PPO | Source: Ambulatory Visit | Attending: Family Medicine | Admitting: Family Medicine

## 2022-09-25 DIAGNOSIS — N6489 Other specified disorders of breast: Secondary | ICD-10-CM

## 2023-11-16 ENCOUNTER — Other Ambulatory Visit: Payer: Self-pay | Admitting: Family Medicine

## 2023-11-16 DIAGNOSIS — N6320 Unspecified lump in the left breast, unspecified quadrant: Secondary | ICD-10-CM

## 2024-08-01 ENCOUNTER — Encounter: Payer: Self-pay | Admitting: Family Medicine

## 2024-08-02 ENCOUNTER — Ambulatory Visit
Admission: RE | Admit: 2024-08-02 | Discharge: 2024-08-02 | Disposition: A | Discharge status: Home / Self Care | Source: Ambulatory Visit | Attending: Family Medicine | Admitting: Family Medicine

## 2024-08-02 DIAGNOSIS — N6320 Unspecified lump in the left breast, unspecified quadrant: Secondary | ICD-10-CM
